# Patient Record
Sex: Female | Born: 1985 | Race: Black or African American | Hispanic: No | Smoking: Never smoker
Health system: Southern US, Community
[De-identification: ages and names within clinical notes are randomized; demographics above are authoritative.]

## PROBLEM LIST (undated history)

## (undated) HISTORY — PX: TONSILLECTOMY: SUR1361

## (undated) HISTORY — PX: TYMPANOSTOMY TUBE PLACEMENT: SHX32

---
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
## 2005-01-01 ENCOUNTER — Ambulatory Visit: Payer: Self-pay | Admitting: Pediatrics

## 2005-01-04 ENCOUNTER — Ambulatory Visit: Payer: Self-pay

## 2005-02-03 ENCOUNTER — Ambulatory Visit: Payer: Self-pay

## 2005-03-06 ENCOUNTER — Ambulatory Visit: Payer: Self-pay

## 2006-01-08 ENCOUNTER — Emergency Department: Payer: Self-pay | Admitting: Emergency Medicine

## 2006-03-20 ENCOUNTER — Emergency Department: Payer: Self-pay | Admitting: General Practice

## 2007-04-26 ENCOUNTER — Emergency Department: Payer: Self-pay | Admitting: Emergency Medicine

## 2010-02-25 ENCOUNTER — Emergency Department: Payer: Self-pay | Admitting: Emergency Medicine

## 2011-06-09 ENCOUNTER — Emergency Department: Payer: Self-pay | Admitting: Unknown Physician Specialty

## 2011-06-09 LAB — URINALYSIS, COMPLETE
Glucose,UR: NEGATIVE mg/dL (ref 0–75)
Nitrite: NEGATIVE
Protein: NEGATIVE
RBC,UR: 3 /HPF (ref 0–5)
Specific Gravity: 1.027 (ref 1.003–1.030)

## 2011-06-09 LAB — WET PREP, GENITAL

## 2012-01-02 ENCOUNTER — Emergency Department: Payer: Self-pay | Admitting: *Deleted

## 2012-01-02 LAB — URINALYSIS, COMPLETE
Bacteria: NONE SEEN
Glucose,UR: NEGATIVE mg/dL (ref 0–75)
Leukocyte Esterase: NEGATIVE
RBC,UR: 1 /HPF (ref 0–5)
Specific Gravity: 1.024 (ref 1.003–1.030)
Squamous Epithelial: <1
WBC UR: <1 /HPF (ref 0–5)

## 2012-01-02 LAB — CBC
HGB: 13 g/dL (ref 12.0–16.0)
MCH: 27.3 pg (ref 26.0–34.0)
MCHC: 33.4 g/dL (ref 32.0–36.0)
Platelet: 207 10*3/uL (ref 150–440)
RDW: 15.2 % — ABNORMAL HIGH (ref 11.5–14.5)
WBC: 7.5 10*3/uL (ref 3.6–11.0)

## 2012-01-02 LAB — HCG, QUANTITATIVE, PREGNANCY: Beta Hcg, Quant.: 4903 m[IU]/mL — ABNORMAL HIGH

## 2012-01-04 ENCOUNTER — Emergency Department: Payer: Self-pay | Admitting: Emergency Medicine

## 2012-01-04 LAB — CBC
HCT: 37.7 % (ref 35.0–47.0)
HGB: 12.3 g/dL (ref 12.0–16.0)
MCHC: 32.6 g/dL (ref 32.0–36.0)
MCV: 83 fL (ref 80–100)
RDW: 15.2 % — ABNORMAL HIGH (ref 11.5–14.5)

## 2012-04-21 ENCOUNTER — Emergency Department: Payer: Self-pay | Admitting: Emergency Medicine

## 2012-04-21 LAB — URINALYSIS, COMPLETE
Bacteria: NONE SEEN
Bilirubin,UR: NEGATIVE
Glucose,UR: NEGATIVE mg/dL (ref 0–75)
Nitrite: NEGATIVE
Ph: 5 (ref 4.5–8.0)
RBC,UR: <1 /HPF (ref 0–5)
Specific Gravity: 1.023 (ref 1.003–1.030)
WBC UR: 1 /HPF (ref 0–5)

## 2012-05-25 ENCOUNTER — Emergency Department: Payer: Self-pay | Admitting: Emergency Medicine

## 2012-05-25 LAB — URINALYSIS, COMPLETE
Bilirubin,UR: NEGATIVE
Glucose,UR: NEGATIVE mg/dL (ref 0–75)
Nitrite: POSITIVE
Ph: 6 (ref 4.5–8.0)
Protein: NEGATIVE
Squamous Epithelial: 4

## 2012-12-14 ENCOUNTER — Encounter: Payer: Self-pay | Admitting: Obstetrics and Gynecology

## 2012-12-21 ENCOUNTER — Encounter: Payer: Self-pay | Admitting: Maternal and Fetal Medicine

## 2012-12-28 ENCOUNTER — Encounter: Payer: Self-pay | Admitting: Obstetrics and Gynecology

## 2013-01-19 ENCOUNTER — Emergency Department: Payer: Self-pay | Admitting: Internal Medicine

## 2013-01-19 LAB — URINALYSIS, COMPLETE
Bilirubin,UR: NEGATIVE
Blood: NEGATIVE
Glucose,UR: NEGATIVE mg/dL (ref 0–75)
Ketone: NEGATIVE
Nitrite: NEGATIVE
Ph: 5 (ref 4.5–8.0)
Protein: NEGATIVE
Protein: NEGATIVE
RBC,UR: 3 /HPF (ref 0–5)
Specific Gravity: 1.035 (ref 1.003–1.030)
WBC UR: 4 /HPF (ref 0–5)
WBC UR: 1 /HPF (ref 0–5)

## 2013-01-19 LAB — WET PREP, GENITAL

## 2013-01-19 LAB — GC/CHLAMYDIA PROBE AMP

## 2013-06-27 ENCOUNTER — Inpatient Hospital Stay: Payer: Self-pay | Admitting: Obstetrics and Gynecology

## 2013-06-27 LAB — CBC WITH DIFFERENTIAL/PLATELET
Basophil #: 0.1 10*3/uL (ref 0.0–0.1)
Basophil %: 0.9 %
Eosinophil #: 0 10*3/uL (ref 0.0–0.7)
Eosinophil %: 0.4 %
HCT: 35 % (ref 35.0–47.0)
HGB: 11.6 g/dL — ABNORMAL LOW (ref 12.0–16.0)
LYMPHS ABS: 2 10*3/uL (ref 1.0–3.6)
Lymphocyte %: 27.2 %
MCH: 25.9 pg — ABNORMAL LOW (ref 26.0–34.0)
MCHC: 33.1 g/dL (ref 32.0–36.0)
MCV: 78 fL — ABNORMAL LOW (ref 80–100)
MONO ABS: 0.5 x10 3/mm (ref 0.2–0.9)
MONOS PCT: 6.1 %
NEUTROS ABS: 4.9 10*3/uL (ref 1.4–6.5)
Neutrophil %: 65.4 %
Platelet: 164 10*3/uL (ref 150–440)
RBC: 4.48 10*6/uL (ref 3.80–5.20)
RDW: 16.3 % — ABNORMAL HIGH (ref 11.5–14.5)
WBC: 7.5 10*3/uL (ref 3.6–11.0)

## 2013-06-27 LAB — CREATININE, SERUM: Creatinine: 0.68 mg/dL (ref 0.60–1.30)

## 2013-06-28 LAB — HEMATOCRIT: HCT: 31.2 % — ABNORMAL LOW (ref 35.0–47.0)

## 2014-01-25 ENCOUNTER — Emergency Department: Payer: Self-pay | Admitting: Emergency Medicine

## 2014-01-25 LAB — TROPONIN I: Troponin-I: <0.02 ng/mL

## 2014-01-25 LAB — CBC
HCT: 37.6 % (ref 35.0–47.0)
HGB: 11.9 g/dL — AB (ref 12.0–16.0)
MCH: 24.6 pg — ABNORMAL LOW (ref 26.0–34.0)
MCHC: 31.7 g/dL — ABNORMAL LOW (ref 32.0–36.0)
MCV: 77 fL — AB (ref 80–100)
PLATELETS: 215 10*3/uL (ref 150–440)
RBC: 4.85 10*6/uL (ref 3.80–5.20)
RDW: 17.7 % — ABNORMAL HIGH (ref 11.5–14.5)
WBC: 6.6 10*3/uL (ref 3.6–11.0)

## 2014-01-25 LAB — BASIC METABOLIC PANEL
ANION GAP: 7 (ref 7–16)
BUN: 12 mg/dL (ref 7–18)
CALCIUM: 8.5 mg/dL (ref 8.5–10.1)
CO2: 23 mmol/L (ref 21–32)
Chloride: 110 mmol/L — ABNORMAL HIGH (ref 98–107)
Creatinine: 0.96 mg/dL (ref 0.60–1.30)
EGFR (African American): >60
EGFR (Non-African Amer.): >60
Glucose: 68 mg/dL (ref 65–99)
Osmolality: 277 (ref 275–301)
Potassium: 3.6 mmol/L (ref 3.5–5.1)
Sodium: 140 mmol/L (ref 136–145)

## 2014-08-26 NOTE — Consult Note (Signed)
Referral Information:  Reason for Referral Morbid obesity, type II diabetes and suspected HTN in pregnancy   Referring Physician [facility]   Prenatal Hx [name] is a 29yo G2P0010 at 5812w3d by LMP (10/04/2012) who presents in consult.  She had an 8 week miscarriage at [facility] in [facility] (no D&C required).  Has a history of type II diabetes diagnosed at about age 89.  She was on Metformin from the time of diagnosis until she self-discontinued about 1 year ago.  HgbA1C this pregnancy was 6.  She checks her BS a couple of times a week.  She is also morbidly obese with a BMI of 64.  She states that her weight fluctuates and she feels she is a little less heavy than normal.  She does not exercise because she doesn't have anyone to do it with.  She denies a history of HTN but has had some mild range elevated BPs.   Past Obstetrical Hx 2013 8 week spontaneous abortion (no D&C).   Home Medications: Medication Instructions Status  Prenatal Multivitamins Prenatal Multivitamins oral tablet 1 tab(s) orally once a day Active   Allergies:   Antihistamine: Other  Vital Signs/Notes:  Nursing Vital Signs: **Vital Signs.:   11-Aug-14 10:15  Temperature Temperature (F) 98.2  Celsius 36.7  Temperature Source oral  Pulse Pulse 79  Respirations Respirations 20  Systolic BP Systolic BP 122  Diastolic BP (mmHg) Diastolic BP (mmHg) 75  Mean BP 90   Perinatal Consult:  LMP 04-Oct-2012   PGyn Hx Historoy of abnormal PAPs, s/p colposcopy; per patient, history of gonorrhea, chlamydia and syphylis   PMed Hx Rubella Nonimmune   Past Medical History cont'd Morbid obesity (BMI 64) Type II diabetes Hypertension   PSurg Hx Tonsils and adenoids removed (age 89); multiple sets of tubes in her ears; wisdom teeth   FHx Mother with HTN and MI; dad was killed on the job; maternal uncle with DM   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   Recommendations 1.  Morbid obesity.  Recommend baseline CMP, TSH and urine p/c ratio (not ordered today).  Add 1 mg folic acid to PNV and start baby ASA.  Referral to [facility].  Limit weight gain to <15 #.  Maternal EKG and echocardiogram (not ordered).  Encourage mild exercise (ie walking).  We discussed the risks associated with obesity in pregnancy including miscarriage, stillbirth, birth defects, difficulty in evaluating fetal anatomy, preeclampsia and need for cesarean delivery.  Recommend serial ultrasounds for growth (monthly after 28 weeks) and antenatal testing starting at 32-34 weeks.  Would recommend postpartum referral to weight loss management center and/or bariatric surgery.  Consider referral to [facility] or [facility] for obstetric bariatric services on L&D.  Will also need anesthesia consult in the third trimester. 2.  Type II diabetes - not currently being treated.  Was on Metformin until  about 1 year ago.  Most recent HgbA1C 6.0 in July.  Discussed recommendation for BS testing 4x daily with FBS goal <90 and 2hr PP meals <120.  Recommend eye exam (if not up to date in past year).  We briefly discussed treatment options including diet, glyburide and/or insulin.  Offered patient first trimester screening (scheduled in 2 weeks).  Recommend ultrasound to evaluate fetal anatomy at about 18 weeks and fetal echocardiogram at about 22 weeks. 3.  Hypertension - not previously  diagnosed.  Recommend (as above), baseline p/c ratio and CMP, maternal EKG and echocardiogram.  Currently stable. 4.  Rubella and varicella non-immune.  Immunize postpartum. 5.  Return for follow up consult and BS review in 1 week.  Will also refer to [facility] (no other orders placed today).   Plan:  Prenatal Diagnosis Options First trimester, Level II [place]   Ultrasound at what gestational ages Monthly > 28 weeks   Antepartum Testing Starting at 32 weeks    Total Time Spent with Patient 30 minutes   >50% of visit spent in couseling/coordination of care yes   Office Use Only [address]  Level 2 ( ) NEW office consult exp prob focused   Coding Description: MATERNAL CONDITIONS/HISTORY INDICATION(S).   Chronic HTN.   Diabetes - DM.   Obesity - BMI greater than equal to 30.  Electronic Signatures: Kirby Funk (MD)  (Signed 11-Aug-14 12:05)  Authored: Referral, Home Medications, Allergies, Vital Signs/Notes, Consult, Exam, Lab/Radiology Notes, Impression, Plan, Billing, Coding Description   Last Updated: 11-Aug-14 12:05 by Kirby Funk (MD)

## 2014-09-13 NOTE — H&P (Signed)
L&D Evaluation:  History Expanded:  HPI 29 yo G2P0010 AA F at 38 weeks s EGA w contractions and pain this am, [facility] due to Obesity, Type II DM diet controlled, and labile untreated HTN.  GBS + noted.  No other reported pbs this pregnancy.  Prenatal Care records reviewed.   Gravida 2   Term 0   PreTerm 0   Abortion 1   Living 0   Group B Strep Results Maternal (Result >5wks must be treated as unknown) positive   Presents with contractions   Patient's Medical History Diabetes  Hypertension  Obesity   Patient's Surgical History none   Medications Pre Natal Vitamins   Allergies Antihistamines   Social History none   Family History Non-Contributory   ROS:  ROS All systems were reviewed.  HEENT, CNS, GI, GU, Respiratory, CV, Renal and Musculoskeletal systems were found to be normal.   Exam:  Vital Signs stable   General no apparent distress   Mental Status clear   Chest clear   Heart normal sinus rhythm, no murmur/gallop/rubs   Abdomen gravid, tender with contractions   Estimated Fetal Weight Average for gestational age   Back no CVAT   Edema no edema   Pelvic no external lesions, 5/90/0   Mebranes Intact   FHT normal rate with no decels   Skin dry   Lymph no lymphadenopathy   Impression:  Impression active labor   Plan:  Plan EFM/NST, monitor contractions and for cervical change, monitor BP, antibiotics for GBBS prophylaxis   Comments BS 92. AROM clear. IUPC, FSE placed. Stadol for pain. Risks of obesity and delivery discussed.  Risks of anethesia and Cesarean Section discussed.   Electronic Signatures: Letitia LibraHarris, Rayquon Uselman Paul (MD)  (Signed [phone] [id number]-Feb-15 13:08)  Authored: L&D Evaluation   Last Updated: 22-Feb-15 13:08 by Letitia LibraHarris, Rashod Gougeon Paul (MD)

## 2014-11-04 IMAGING — US US OB NUCHAL TRANSLUCENCY 1ST GEST - [facility] NRPT
1 series · 14 of 28 positions shown · non-contrast
Comparison: none

[Series 1: us ob nuchal translucency 1st gest - [facility] nrpt · 0.23mm/px · 14 of 58 slices shown]
[im 3/58]
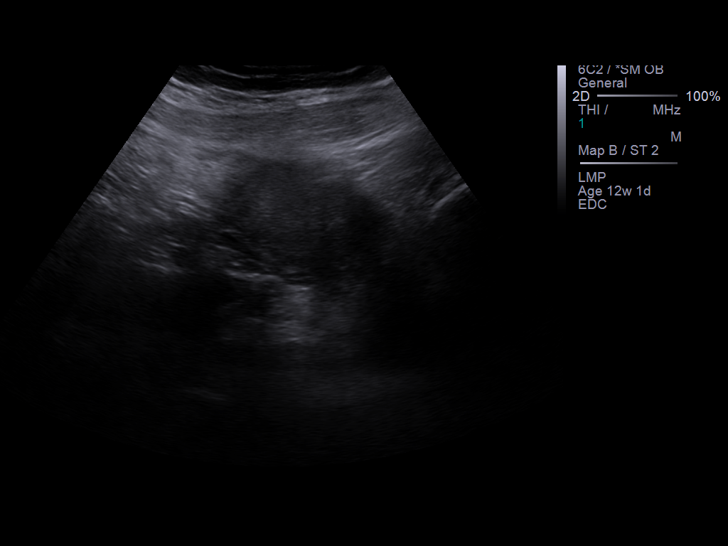
[im 7/58]
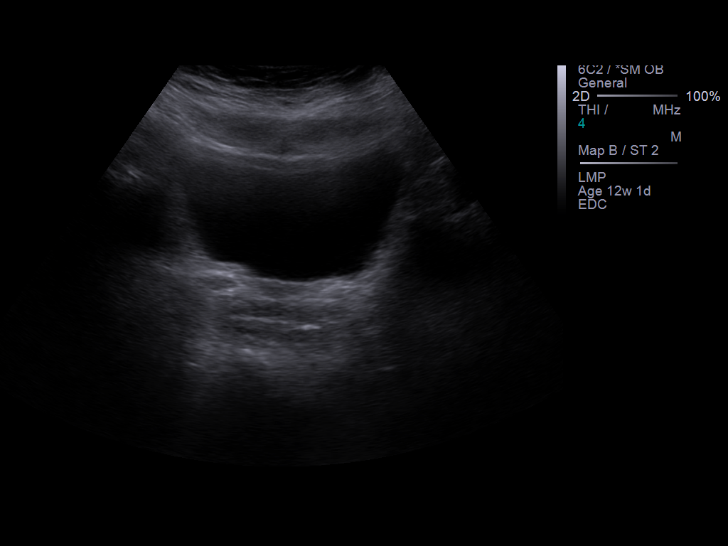
[im 11/58]
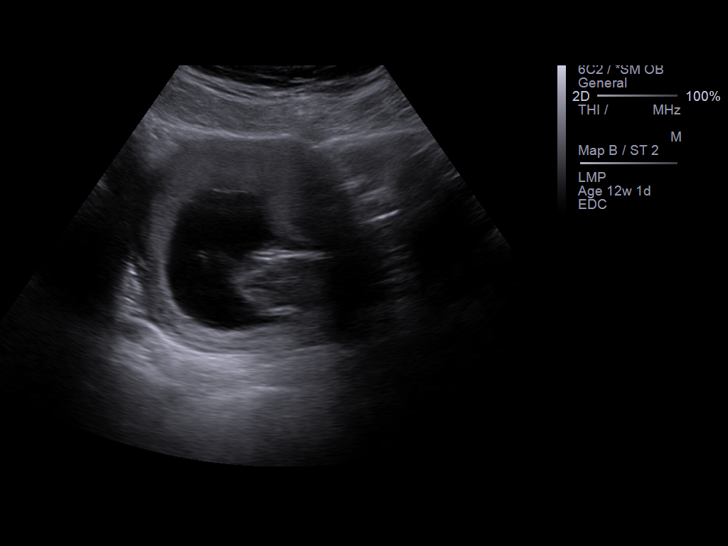
[im 15/58]
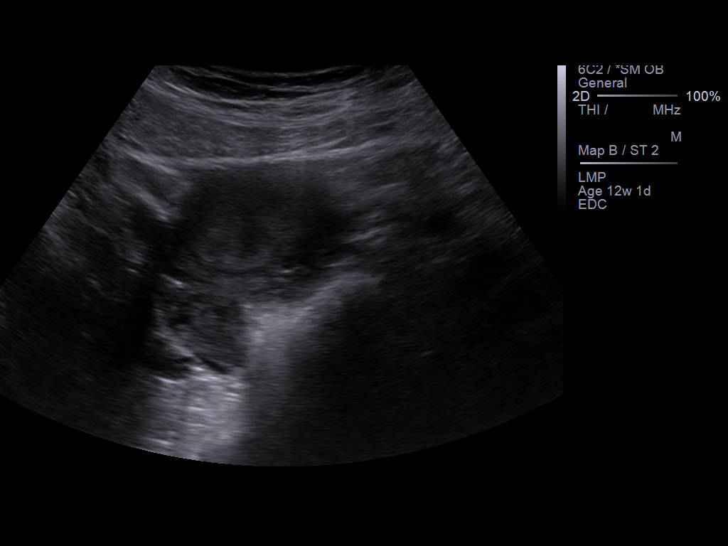
[im 20/58]
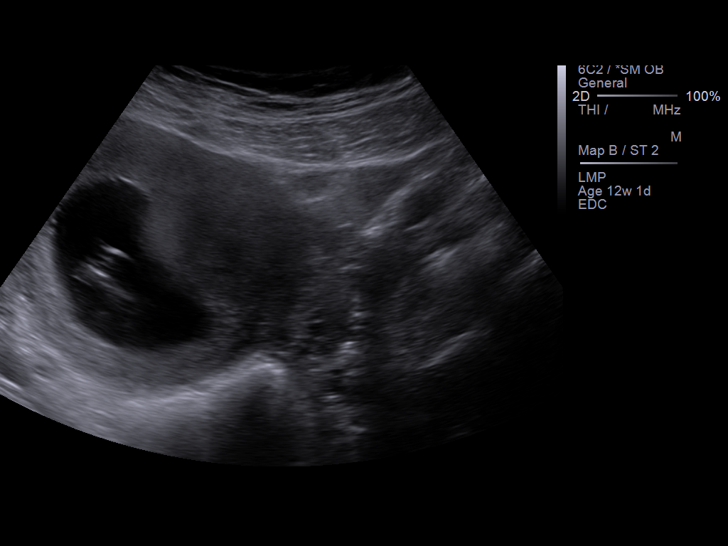
[im 24/58]
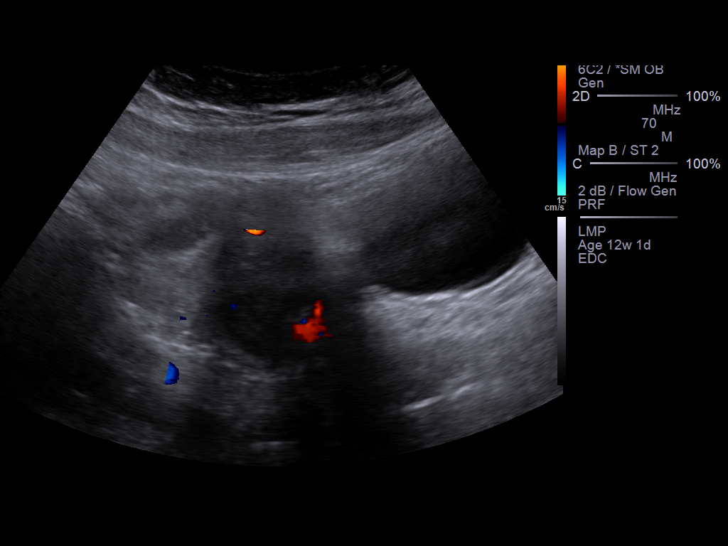
[im 28/58]
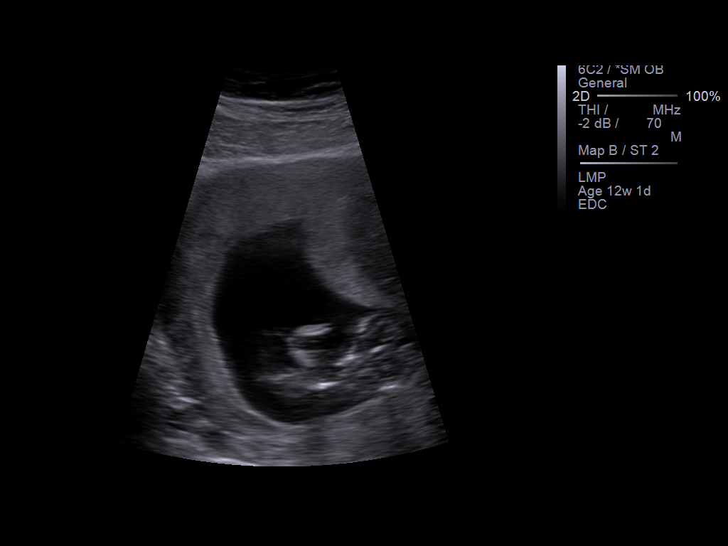
[im 32/58]
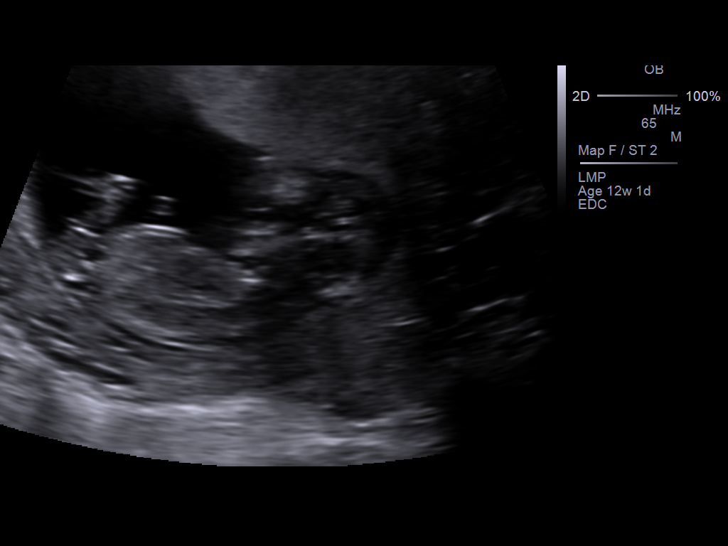
[im 36/58]
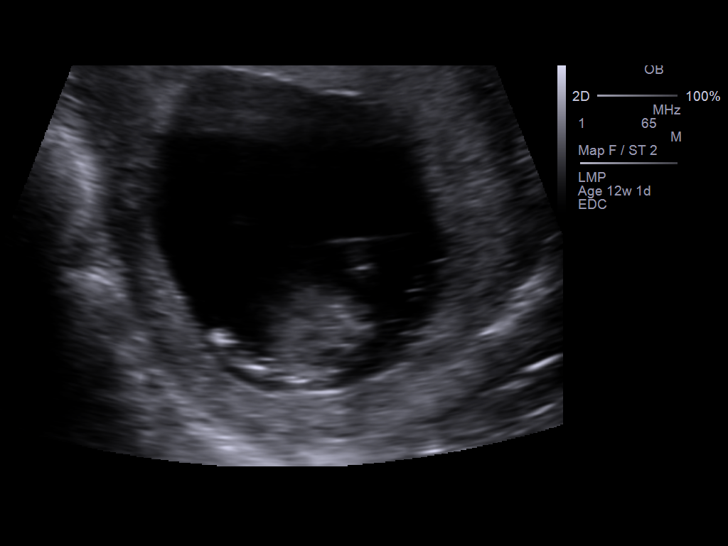
[im 41/58]
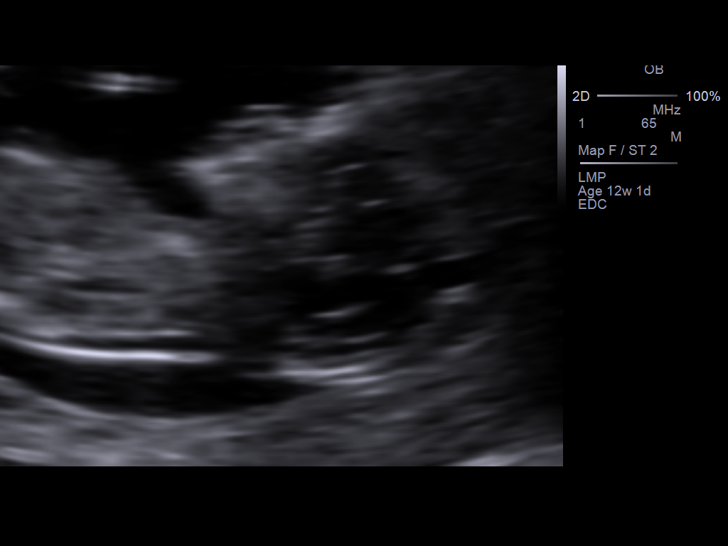
[im 45/58]
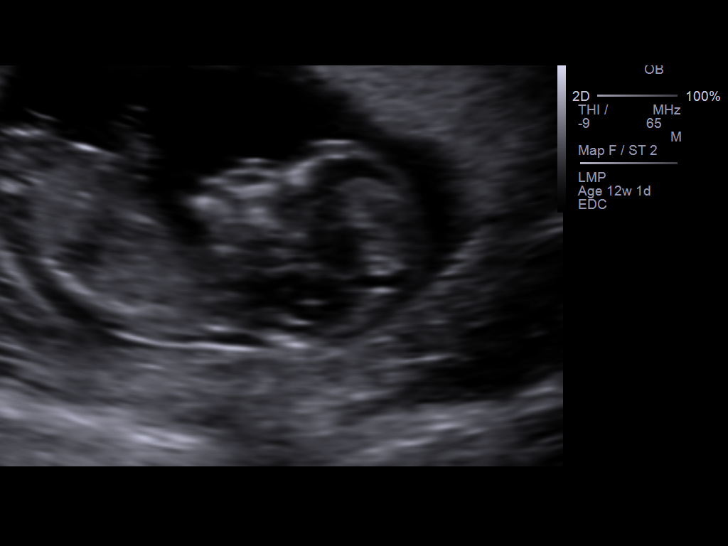
[im 49/58]
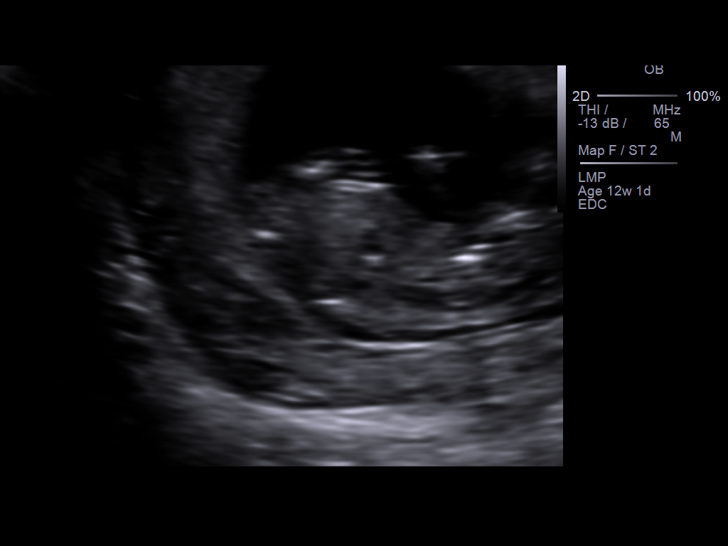
[im 53/58]
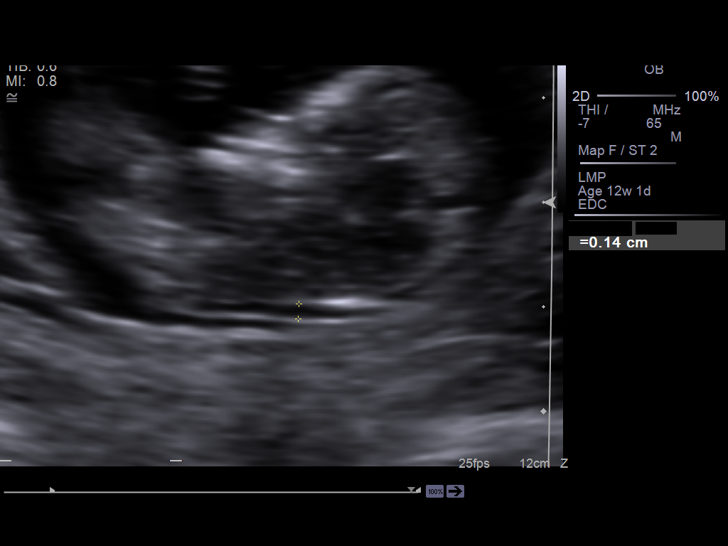
[im 58/58]
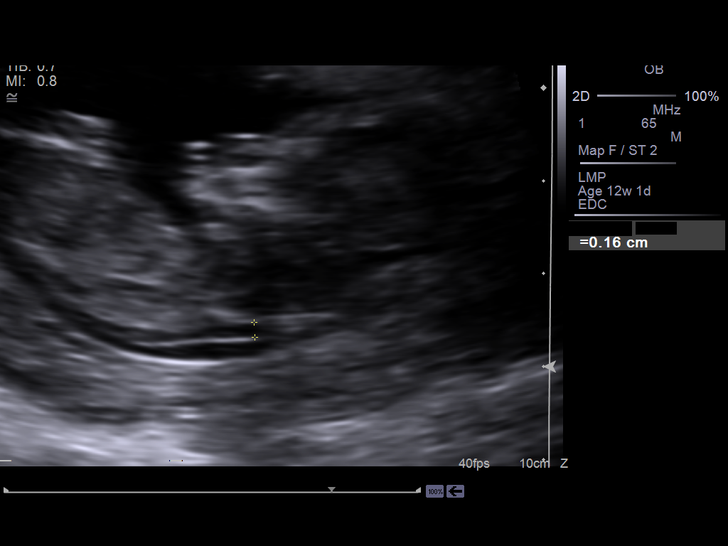

[14 of 28 positions shown; findings below may reference images not displayed]

IMAGES IMPORTED FROM THE SYNGO WORKFLOW SYSTEM
NO DICTATION FOR STUDY

## 2015-12-02 IMAGING — CR DG CHEST 2V
1 series · 2 of 2 positions shown · non-contrast
Comparison: None.

CLINICAL DATA: Chest pain

EXAM:
CHEST  2 VIEW

[Series 1: dxr chest pa (or ap) and lateral · 0.14mm/px · 2 of 2 slices shown]
[im 1/2]
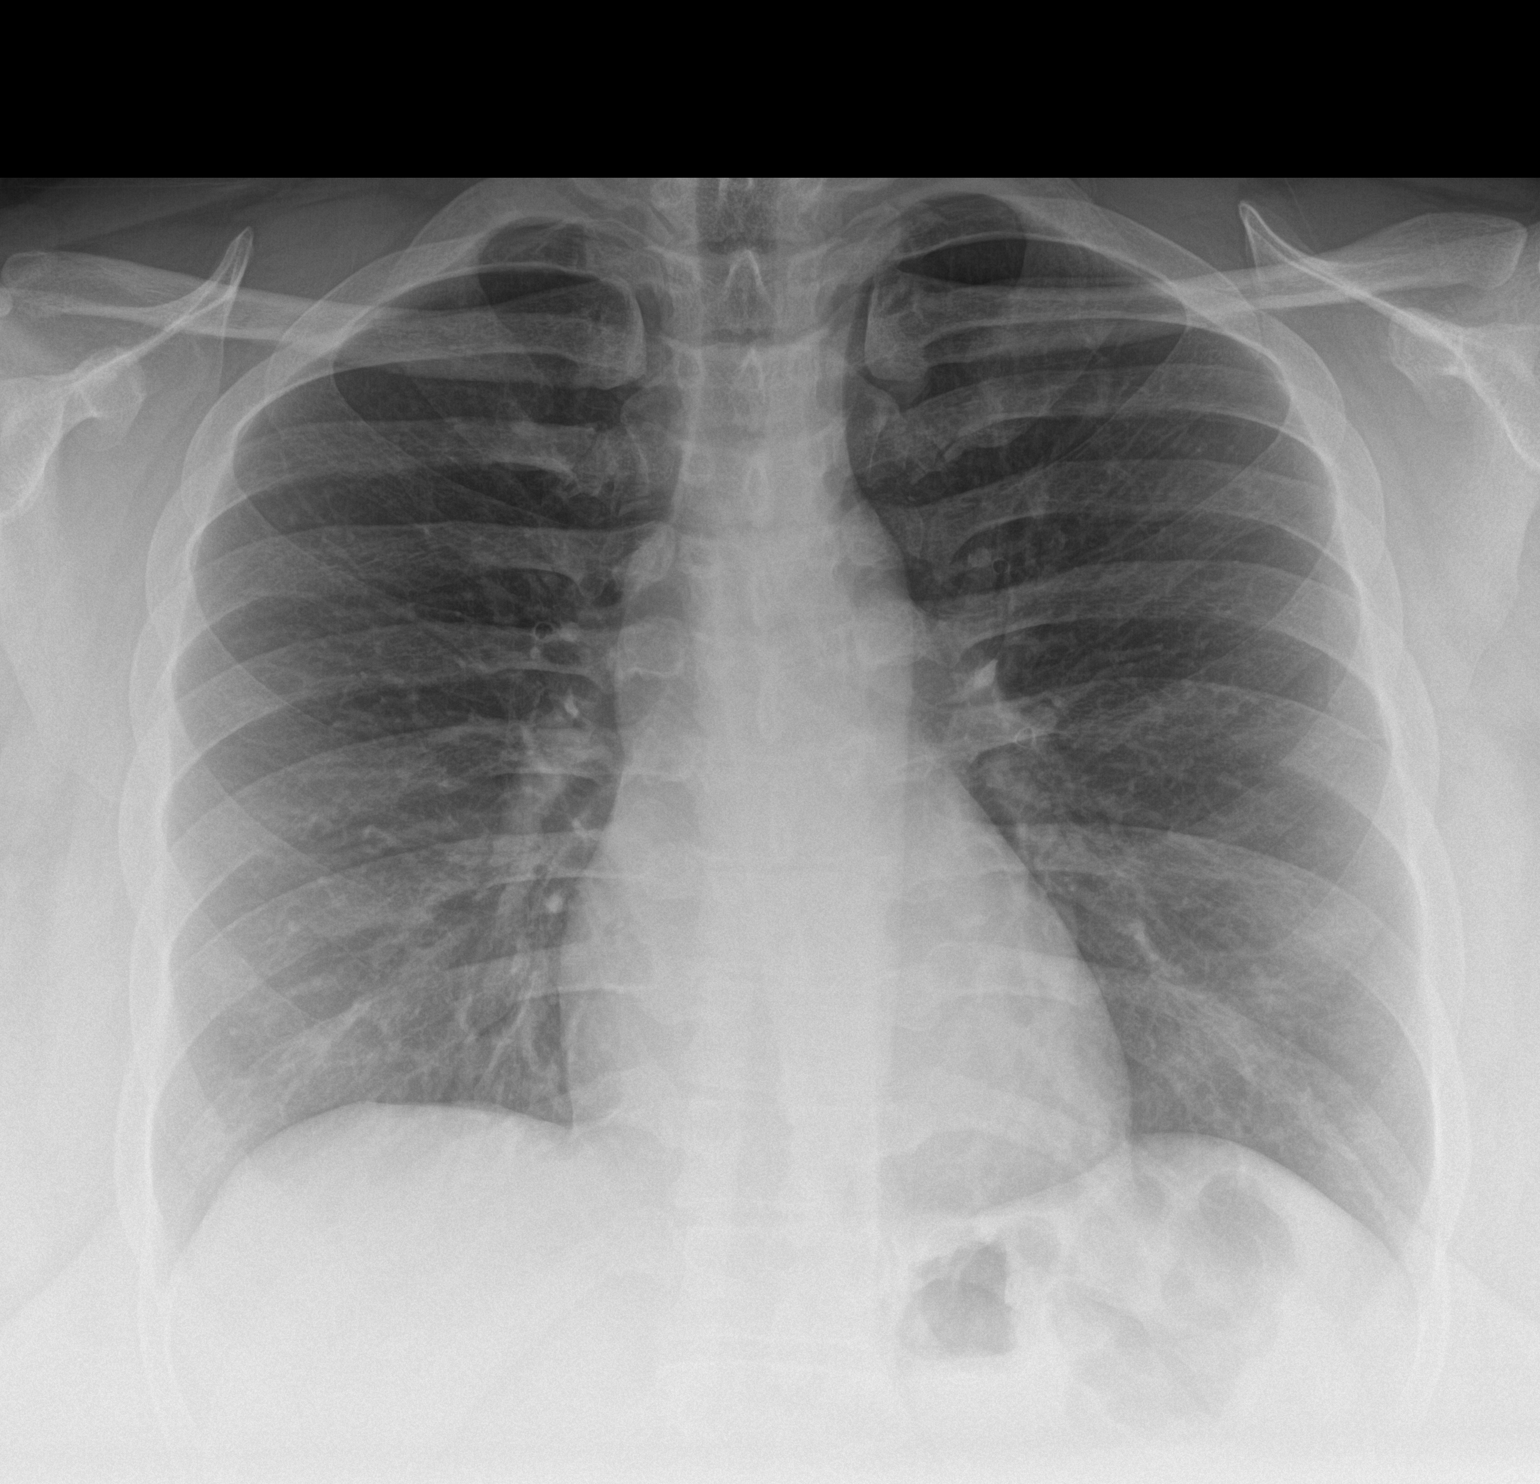
[im 2/2]
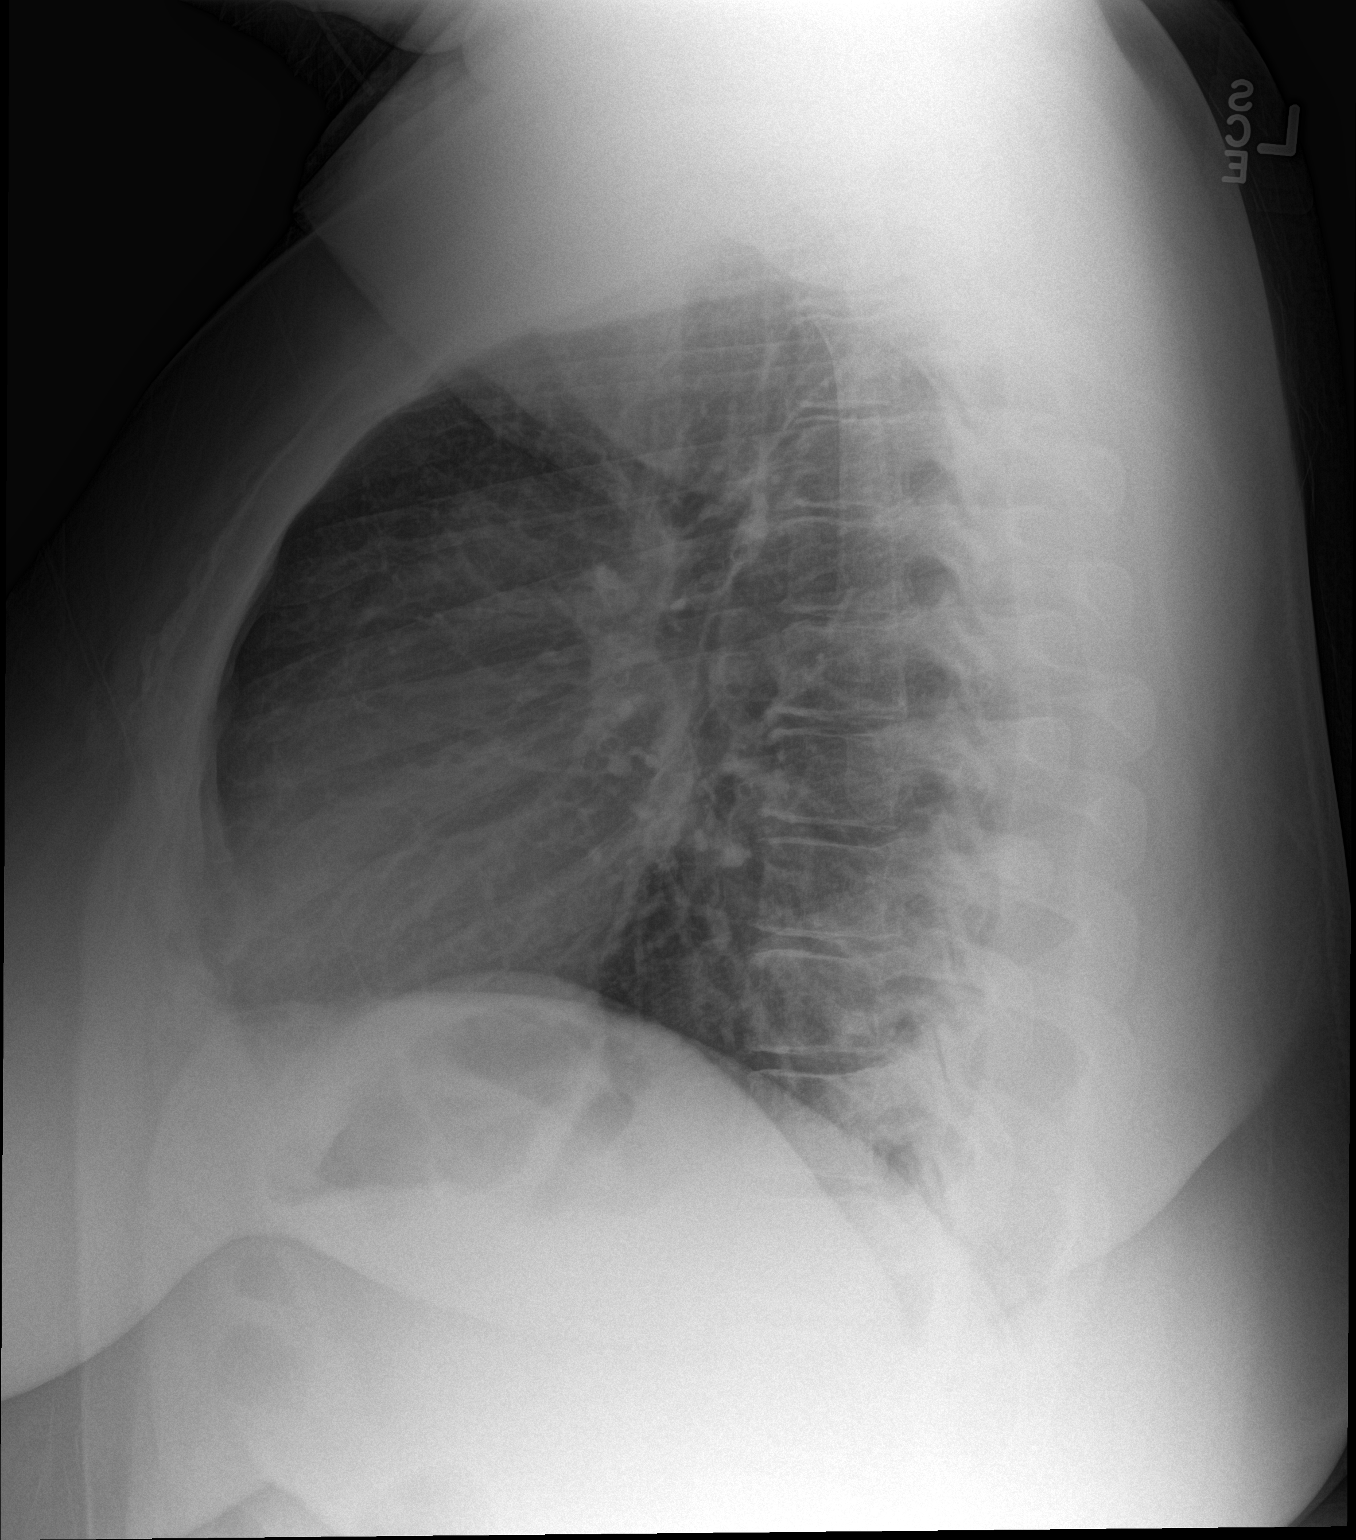

[2 of 2 positions shown; findings below may reference images not displayed]

FINDINGS: Lungs are clear. Heart size and pulmonary vascularity are normal. No
pneumothorax. No adenopathy. No bone lesions.
IMPRESSION: No edema or consolidation.

## 2016-11-13 LAB — HM HIV SCREENING LAB: HM HIV Screening: NEGATIVE

## 2016-11-13 LAB — HM PAP SMEAR: HM Pap smear: NEGATIVE

## 2018-01-25 ENCOUNTER — Other Ambulatory Visit: Payer: Self-pay

## 2018-01-25 ENCOUNTER — Emergency Department
Admission: EM | Admit: 2018-01-25 | Discharge: 2018-01-25 | Disposition: A | Discharge status: Home / Self Care | Payer: Self-pay | Attending: Emergency Medicine | Admitting: Emergency Medicine

## 2018-01-25 ENCOUNTER — Encounter: Payer: Self-pay | Admitting: Emergency Medicine

## 2018-01-25 DIAGNOSIS — H1031 Unspecified acute conjunctivitis, right eye: Secondary | ICD-10-CM

## 2018-01-25 MED ORDER — TOBRAMYCIN 0.3 % OP SOLN: 2.0000 [drp] | OPHTHALMIC | 0 refills | Status: DC

## 2018-01-25 NOTE — ED Triage Notes (Signed)
Right eye redness, soreness.  Noticed redness today.

## 2018-01-25 NOTE — ED Provider Notes (Signed)
[facility] Emergency Department Provider Note  ____________________________________________   First MD Initiated Contact with Patient 01/25/18 1603     (approximate)  I have reviewed the triage vital signs and the nursing notes.   HISTORY  Chief Complaint Eye Problem    HPI Monica Medina is a 32 y.o. female presents emergency department complaint of right eye redness and soreness.  She denies any drainage or matting at this time.  Symptoms started this morning when she awoke.  She denies any fever or chills.  No change in her vision.    History reviewed. No pertinent past medical history.  There are no active problems to display for this patient.   Past Surgical History:  Procedure Laterality Date  . TONSILLECTOMY    . TYMPANOSTOMY TUBE PLACEMENT      Prior to Admission medications   Medication Sig Start Date End Date Taking? Authorizing Provider  tobramycin (TOBREX) 0.3 % ophthalmic solution Place 2 drops into both eyes every 4 (four) hours. 01/25/18   Faythe GheeFisher, Susan W, PA-C    Allergies Patient has no known allergies.  No family history on file.  Social History Social History   Tobacco Use  . Smoking status: Never Smoker  . Smokeless tobacco: Never Used  Substance Use Topics  . Alcohol use: Not on file  . Drug use: Not on file    Review of Systems  Constitutional: No fever/chills Eyes: No visual changes.  Positive for right eye redness ENT: No sore throat. Respiratory: Denies cough Genitourinary: Negative for dysuria. Musculoskeletal: Negative for back pain. Skin: Negative for rash.    ____________________________________________   PHYSICAL EXAM:  VITAL SIGNS: ED Triage Vitals  Enc Vitals Group     BP 01/25/18 1538 127/64     Pulse Rate 01/25/18 1538 78     Resp 01/25/18 1538 16     Temp 01/25/18 1538 98.7 F (37.1 C)     Temp Source 01/25/18 1538 Oral     SpO2 01/25/18 1538 97 %     Weight 01/25/18 1535  (!) 370 lb (167.8 kg)     Height 01/25/18 1535 5\' 4"  (1.626 m)     Head Circumference --      Peak Flow --      Pain Score 01/25/18 1535 4     Pain Loc --      Pain Edu? --      Excl. in GC? --     Constitutional: Alert and oriented. Well appearing and in no acute distress. Eyes: Conjunctivae are injected in the right eye.  No pus or matting is noted.  Left eye is within normal limits Head: Atraumatic. Nose: No congestion/rhinnorhea. Mouth/Throat: Mucous membranes are moist.   Neck:  supple no lymphadenopathy noted Cardiovascular: Normal rate, regular rhythm. Heart sounds are normal Respiratory: Normal respiratory effort.  No retractions, lungs c t a  GU: deferred Musculoskeletal: FROM all extremities, warm and well perfused Neurologic:  Normal speech and language.  Skin:  Skin is warm, dry and intact. No rash noted. Psychiatric: Mood and affect are normal. Speech and behavior are normal.  ____________________________________________   LABS (all labs ordered are listed, but only abnormal results are displayed)  Labs Reviewed - No data to display ____________________________________________   ____________________________________________  RADIOLOGY    ____________________________________________   PROCEDURES  Procedure(s) performed: No  Procedures    ____________________________________________   INITIAL IMPRESSION / ASSESSMENT AND PLAN / ED COURSE  Pertinent labs & imaging  results that were available during my care of the patient were reviewed by me and considered in my medical decision making (see chart for details).    Patient is 32 year old female presents emergency department complaining of right eye redness.  No visual changes or injury.  Physical exam the right eye has injected conjunctiva in the right side.  No matting or drainage noted.  Remainder the exam is unremarkable  Discussed findings with patient.  Due to concerns of pinkeye with her  employer she was given prescription of tobramycin ophthalmic drops.  She is able to return if she has had 24 hours of eyedrops.  She was given a work note stating this.  She was discharged in stable condition with instructions to follow-up with eye doctor if worsening her regular doctor if not better in 3 to 5 days.    As part of my medical decision making, I reviewed the following data within the electronic MEDICAL RECORD NUMBER Nursing notes reviewed and incorporated, Notes from prior ED visits and [place] Controlled Substance Database  ____________________________________________   FINAL CLINICAL IMPRESSION(S) / ED DIAGNOSES  Final diagnoses:  Acute conjunctivitis of right eye, unspecified acute conjunctivitis type      NEW MEDICATIONS STARTED DURING THIS VISIT:  New Prescriptions   TOBRAMYCIN (TOBREX) 0.3 % OPHTHALMIC SOLUTION    Place 2 drops into both eyes every 4 (four) hours.     Note:  This document was prepared using Dragon voice recognition software and may include unintentional dictation errors.    Faythe Ghee, PA-C 01/25/18 1616    Merrily Brittle, MD 01/26/18 [phone]

## 2018-01-25 NOTE — Discharge Instructions (Addendum)
Follow-up with your regular doctor or the acute care if not better in 3 days.  If you are worsening please follow-up with [facility].  Take the medication as prescribed.  Once you have had drops for the next 24 hours she may return to work.

## 2018-01-31 ENCOUNTER — Encounter: Payer: Self-pay | Admitting: Emergency Medicine

## 2018-01-31 ENCOUNTER — Emergency Department
Admission: EM | Admit: 2018-01-31 | Discharge: 2018-01-31 | Disposition: A | Discharge status: Home / Self Care | Payer: Self-pay | Attending: Emergency Medicine | Admitting: Emergency Medicine

## 2018-01-31 ENCOUNTER — Other Ambulatory Visit: Payer: Self-pay

## 2018-01-31 DIAGNOSIS — H1011 Acute atopic conjunctivitis, right eye: Secondary | ICD-10-CM | POA: Insufficient documentation

## 2018-01-31 MED ORDER — TETRACAINE HCL 0.5 % OP SOLN
1.0000 [drp] | Freq: Once | OPHTHALMIC | Status: AC
  Administered 2018-01-31: 1 [drp] via OPHTHALMIC
  Filled 2018-01-31: qty 4

## 2018-01-31 MED ORDER — FLUORESCEIN SODIUM 1 MG OP STRP
1.0000 | ORAL_STRIP | Freq: Once | OPHTHALMIC | Status: AC
  Administered 2018-01-31: 1 via OPHTHALMIC
  Filled 2018-01-31: qty 1

## 2018-01-31 MED ORDER — KETOROLAC TROMETHAMINE 0.5 % OP SOLN: 1.0000 [drp] | Freq: Four times a day (QID) | OPHTHALMIC | 0 refills | Status: DC

## 2018-01-31 NOTE — Discharge Instructions (Addendum)
Call [facility] for an appointment.  Begin using Acular ophthalmic solution.  1 drop to the right eye 4 times a day.

## 2018-01-31 NOTE — ED Notes (Signed)
See triage note  States she was seen on Sunday for right eye irritation   states the redness went away but returned this am    Min drainage noted this am

## 2018-01-31 NOTE — ED Provider Notes (Signed)
[facility] Emergency Department Provider Note   ____________________________________________   First MD Initiated Contact with Patient 01/31/18 1340     (approximate)  I have reviewed the triage vital signs and the nursing notes.   HISTORY  Chief Complaint Eye Problem   HPI Monica Medina is a 32 y.o. female presents to the emergency department with complaint of right eye irritation and redness that began this morning.  Patient states that she was seen 1 week ago for the same and was given some eyedrops.  She states that the redness that she experienced at that time completely resolved.  She states there has been some clear drainage from the area but nothing that was purulent.  She denies any matting of her eyelashes or foreign body sensation.  She denies any changes in her vision.  Patient does endorse some itching of her right eye and occasional sneezing.  She states she is unable to take antihistamines due to an adverse reaction.  History reviewed. No pertinent past medical history.  There are no active problems to display for this patient.   Past Surgical History:  Procedure Laterality Date  . TONSILLECTOMY    . TYMPANOSTOMY TUBE PLACEMENT      Prior to Admission medications   Medication Sig Start Date End Date Taking? Authorizing Provider  ketorolac (ACULAR) 0.5 % ophthalmic solution Place 1 drop into the right eye 4 (four) times daily. 01/31/18   Tommi Rumps, PA-C    Allergies Patient has no known allergies.  No family history on file.  Social History Social History   Tobacco Use  . Smoking status: Never Smoker  . Smokeless tobacco: Never Used  Substance Use Topics  . Alcohol use: Not on file  . Drug use: Not on file    Review of Systems Constitutional: No fever/chills Eyes: No visual changes.  Positive right eye redness. Cardiovascular: Denies chest pain. Respiratory: Denies shortness of breath. Musculoskeletal:  Negative for muscle aches. Skin: Negative for rash. Neurological: Negative for headaches, focal weakness or numbness. ___________________________________________   PHYSICAL EXAM:  VITAL SIGNS: ED Triage Vitals [01/31/18 1316]  Enc Vitals Group     BP (!) 142/72     Pulse Rate 62     Resp 18     Temp 98 F (36.7 C)     Temp Source Oral     SpO2 97 %     Weight (!) 370 lb (167.8 kg)     Height 5\' 4"  (1.626 m)     Head Circumference      Peak Flow      Pain Score 0     Pain Loc      Pain Edu?      Excl. in GC?    Constitutional: Alert and oriented. Well appearing and in no acute distress. Eyes: Conjunctivae injected on the right.  Left eye conjunctive  is normal.  PERRL. EOMI. tetracaine was placed in the eye.  Lid was inverted no foreign body was noted.  Examination does not show any abnormality.  Fluorescein dye was placed in the right eye.  No corneal abrasion was noted.  No drainage is noted.  No crusting or involvement of the lashes is seen. Head: Atraumatic. Nose: No congestion/rhinnorhea. Neck: No stridor.   Cardiovascular: Normal rate, regular rhythm. Grossly normal heart sounds.  Good peripheral circulation. Respiratory: Normal respiratory effort.  No retractions. Lungs CTAB. Musculoskeletal: Moves upper and lower extremities with any difficulty and normal gait  was noted. Neurologic:  Normal speech and language. No gross focal neurologic deficits are appreciated. Skin:  Skin is warm, dry and intact. No rash noted. Psychiatric: Mood and affect are normal. Speech and behavior are normal.  ____________________________________________   LABS (all labs ordered are listed, but only abnormal results are displayed)  Labs Reviewed - No data to display  PROCEDURES  Procedure(s) performed: None  Procedures  Critical Care performed: No  ____________________________________________   INITIAL IMPRESSION / ASSESSMENT AND PLAN / ED COURSE  As part of my medical  decision making, I reviewed the following data within the electronic MEDICAL RECORD NUMBER Notes from prior ED visits and [place] Controlled Substance Database  Patient presents to the ED with complaint of right eye irritation along with some itching and sneezing.  Patient was seen 1 week ago at which time she states that the antibiotic eyedrops cleared her eye.  She denies any recent injury or visual changes.  Visual acuity was reassuring.  Patient was placed on Acular to begin using 1 drop 4 times a day to the right eye.  Patient relates that she cannot take antihistamines due to nosebleeds.  She states that her doctor has tried her on several things for allergies without any medication not causing nosebleeds.  She is to follow-up with her PCP and also [facility] if any continued problems.  ____________________________________________   FINAL CLINICAL IMPRESSION(S) / ED DIAGNOSES  Final diagnoses:  Allergic conjunctivitis of right eye     ED Discharge Orders         Ordered    ketorolac (ACULAR) 0.5 % ophthalmic solution  4 times daily     01/31/18 1424           Note:  This document was prepared using Dragon voice recognition software and may include unintentional dictation errors.    Tommi Rumps, PA-C 01/31/18 1709    Nita Sickle, MD 02/02/18 [phone]

## 2018-01-31 NOTE — ED Triage Notes (Signed)
R eye irritation and redness this am. States had same one week ago and got some eye drops and it cleared up.

## 2019-01-15 ENCOUNTER — Other Ambulatory Visit: Payer: Self-pay

## 2019-01-15 DIAGNOSIS — Z20822 Contact with and (suspected) exposure to covid-19: Secondary | ICD-10-CM

## 2019-01-17 LAB — NOVEL CORONAVIRUS, NAA: SARS-CoV-2, NAA: NOT DETECTED

## 2019-05-16 ENCOUNTER — Other Ambulatory Visit: Payer: Self-pay

## 2019-05-16 ENCOUNTER — Emergency Department
Admission: EM | Admit: 2019-05-16 | Discharge: 2019-05-16 | Disposition: A | Discharge status: Home / Self Care | Payer: Self-pay | Attending: Emergency Medicine | Admitting: Emergency Medicine

## 2019-05-16 ENCOUNTER — Encounter: Payer: Self-pay | Admitting: Emergency Medicine

## 2019-05-16 DIAGNOSIS — Y939 Activity, unspecified: Secondary | ICD-10-CM | POA: Insufficient documentation

## 2019-05-16 DIAGNOSIS — Y999 Unspecified external cause status: Secondary | ICD-10-CM | POA: Insufficient documentation

## 2019-05-16 DIAGNOSIS — W57XXXA Bitten or stung by nonvenomous insect and other nonvenomous arthropods, initial encounter: Secondary | ICD-10-CM | POA: Insufficient documentation

## 2019-05-16 DIAGNOSIS — E669 Obesity, unspecified: Secondary | ICD-10-CM | POA: Insufficient documentation

## 2019-05-16 DIAGNOSIS — Y929 Unspecified place or not applicable: Secondary | ICD-10-CM | POA: Insufficient documentation

## 2019-05-16 DIAGNOSIS — S80862A Insect bite (nonvenomous), left lower leg, initial encounter: Secondary | ICD-10-CM | POA: Insufficient documentation

## 2019-05-16 NOTE — ED Notes (Signed)
See triage note  Presents with possible insect bite  States noticed area about 2 weeks ago  No redness or drainage note  Afebrile on arrival

## 2019-05-16 NOTE — Discharge Instructions (Addendum)
You were seen today for an insect bite of your left leg.  This insect bite is not infected and is likely turning into a dermatofibroma.  You can apply hydrocortisone cream mixed with Neosporin twice daily for the next 5 days to help with itching.  Please avoid scratching the area.  Dermatofibromas are benign lesions that typically do not require any intervention.  Follow-up if area becomes red, swollen or you notice drainage from the area.

## 2019-05-16 NOTE — ED Provider Notes (Signed)
[facility] Emergency Department Provider Note ____________________________________________  Time seen: 1335  I have reviewed the triage vital signs and the nursing notes.  HISTORY  Chief Complaint  Insect Bite   HPI Monica Medina is a 34 y.o. female presents to the clinic today with complaint of an insect bite to her left posterior calf.  She reports this occurred 2 to 3 weeks ago.  She reports it was initially a little red and drained pus.  It seemed to be improving until 1 week ago.  She reports it is now raised, scaly with darkened skin.  She denies fever, chills or body aches.  She has tried Neosporin OTC with minimal relief.  History reviewed. No pertinent past medical history.  There are no problems to display for this patient.   Past Surgical History:  Procedure Laterality Date  . TONSILLECTOMY    . TYMPANOSTOMY TUBE PLACEMENT      Prior to Admission medications   Not on File    Allergies Other  No family history on file.  Social History Social History   Tobacco Use  . Smoking status: Never Smoker  . Smokeless tobacco: Never Used  Substance Use Topics  . Alcohol use: Not Currently  . Drug use: Not Currently    Review of Systems  Constitutional: Negative for fever, chills. ECardiovascular: Negative for chest pain or chest tightness. Respiratory: Negative for cough or shortness of breath. Skin: Positive for insect bite. Neurological: Negative for headaches, focal weakness, tingling or numbness. ____________________________________________  PHYSICAL EXAM:  VITAL SIGNS: ED Triage Vitals  Enc Vitals Group     BP 05/16/19 1259 131/66     Pulse Rate 05/16/19 1259 71     Resp 05/16/19 1259 16     Temp 05/16/19 1259 98.3 F (36.8 C)     Temp Source 05/16/19 1259 Oral     SpO2 05/16/19 1259 100 %     Weight 05/16/19 1257 (!) 360 lb (163.3 kg)     Height 05/16/19 1257 5\' 4"  (1.626 m)     Head Circumference --      Peak  Flow --      Pain Score 05/16/19 1257 0     Pain Loc --      Pain Edu? --      Excl. in GC? --     Constitutional: Alert and oriented.  Obese, in no distress. Cardiovascular: Normal rate, regular rhythm.  Respiratory: Normal respiratory effort. No wheezes/rales/rhonchi. Neurologic:  Normal gait without ataxia. Normal speech and language.  Skin: 0.5 cm hyperpigmented, scaly raised lesion of left lateral posterior calf.  No redness, swelling or drainage noted from the area. ____________________________________________  INITIAL IMPRESSION / ASSESSMENT AND PLAN / ED COURSE  Insect Bite of Left Calf:  Not infected Likely will turn into a dermatofibroma Avoid picking the area Apply Neosporin mixed with Hydrocortisone BID x 5 days Discussed this is a benign lesion and would not need further intervention likely  ____________________________________________  FINAL CLINICAL IMPRESSION(S) / ED DIAGNOSES  Final diagnoses:  Insect bite of left lower leg, initial encounter   07/14/19, NP    Nicki Reaper, NP 05/16/19 1347    07/14/19, MD 05/16/19 1519

## 2019-05-16 NOTE — ED Triage Notes (Signed)
Pt to ED via POV c/o insect bite on the back of her leg. Pt states that it has been there for about 2 weeks. Pt states that she thinks that it is infected now. Pt denies drainage from the area. No redness noted. Pt is in NAD.

## 2019-09-30 ENCOUNTER — Other Ambulatory Visit: Payer: Self-pay

## 2019-09-30 ENCOUNTER — Ambulatory Visit (LOCAL_COMMUNITY_HEALTH_CENTER): Payer: Self-pay

## 2019-09-30 VITALS — BP 123/79 | Wt >= 6400 oz

## 2019-09-30 DIAGNOSIS — Z3202 Encounter for pregnancy test, result negative: Secondary | ICD-10-CM

## 2019-09-30 LAB — PREGNANCY, URINE: Preg Test, Ur: NEGATIVE

## 2019-10-01 NOTE — Progress Notes (Signed)
Patient reports had period this month and last month but has been having some stomach pains off/on.  States "just haven't felt right". Patient has no PCP at this time; PCP list given. Patient currently has Nexplanon.  Richmond Campbell, RN

## 2020-05-11 ENCOUNTER — Emergency Department
Admission: EM | Admit: 2020-05-11 | Discharge: 2020-05-11 | Disposition: A | Discharge status: Home / Self Care | Payer: HRSA Program | Attending: Family Medicine | Admitting: Family Medicine

## 2020-05-11 ENCOUNTER — Other Ambulatory Visit: Payer: Self-pay

## 2020-05-11 DIAGNOSIS — U071 COVID-19: Secondary | ICD-10-CM | POA: Diagnosis not present

## 2020-05-11 DIAGNOSIS — R059 Cough, unspecified: Secondary | ICD-10-CM | POA: Diagnosis present

## 2020-05-11 DIAGNOSIS — E119 Type 2 diabetes mellitus without complications: Secondary | ICD-10-CM | POA: Diagnosis not present

## 2020-05-11 DIAGNOSIS — B349 Viral infection, unspecified: Secondary | ICD-10-CM

## 2020-05-11 LAB — SARS CORONAVIRUS 2 (TAT 6-24 HRS): SARS Coronavirus 2: POSITIVE — AB

## 2020-05-11 MED ORDER — GUAIFENESIN-CODEINE 100-10 MG/5ML PO SYRP: 5.0000 mL | ORAL_SOLUTION | Freq: Three times a day (TID) | ORAL | 0 refills | Status: DC | PRN

## 2020-05-11 MED ORDER — NAPROXEN 500 MG PO TABS: 500.0000 mg | ORAL_TABLET | Freq: Two times a day (BID) | ORAL | 0 refills | Status: DC

## 2020-05-11 NOTE — ED Notes (Signed)
Pt unable to sign for d/c no access to signature pad. Pt verbalizes d/c instructions with no further questions at this time.

## 2020-05-11 NOTE — ED Triage Notes (Signed)
Pt to ED POV for chief complaint of cough, body aches, diarrhea x1 that started over the weekend.  Ambulatory to triage. RR even and unlabored. NAD noted. Alert and oriented  Denies pain

## 2020-05-11 NOTE — ED Provider Notes (Signed)
[facility] Emergency Department Provider Note  ____________________________________________  Time seen: Approximately 11:31 AM  I have reviewed the triage vital signs and the nursing notes.   HISTORY  Chief Complaint COVID sx   HPI Monica Medina is a 35 y.o. female who presents to the ER for COVID symptoms that started 5 days ago. No known exposure. No fever. She does have a nonproductive cough and low grade fever. No relief with OTC medications.   History reviewed. No pertinent past medical history.  Patient Active Problem List   Diagnosis Date Noted  . DM type 2 (diabetes mellitus, type 2) (HCC) 01/07/2013  . Morbid obesity with BMI of 60.0-69.9, adult (HCC) 01/07/2013    Past Surgical History:  Procedure Laterality Date  . TONSILLECTOMY    . TYMPANOSTOMY TUBE PLACEMENT      Prior to Admission medications   Medication Sig Start Date End Date Taking? Authorizing Provider  guaiFENesin-codeine (ROBITUSSIN AC) 100-10 MG/5ML syrup Take 5 mLs by mouth 3 (three) times daily as needed for cough. 05/11/20  Yes Mizraim Harmening B, FNP  naproxen (NAPROSYN) 500 MG tablet Take 1 tablet (500 mg total) by mouth 2 (two) times daily with a meal. 05/11/20  Yes Nevena Rozenberg B, FNP  etonogestrel (NEXPLANON) 68 MG IMPL implant 1 each by Subdermal route once. 10/12/18   Matt Holmes, PA    Allergies Antihistamines, diphenhydramine-type  Family History  Problem Relation Age of Onset  . Hypertension Mother   . Hypertension Sister   . Prostate cancer Paternal Grandfather     Social History Social History   Tobacco Use  . Smoking status: Never Smoker  . Smokeless tobacco: Never Used  Substance Use Topics  . Alcohol use: Not Currently  . Drug use: Not Currently    Review of Systems Constitutional: Positive for fever/chills. Normal appetite. ENT: No sore throat. Cardiovascular: Denies chest pain. Respiratory: Negative for shortness of breath.  Positive for cough. Negative for wheezing.  Gastrointestinal: Negative for nausea,  no vomiting.  Negative diarrhea.  Musculoskeletal: Positive for body aches Skin: Negative for rash. Neurological: Positive for headaches ____________________________________________   PHYSICAL EXAM:  VITAL SIGNS: ED Triage Vitals  Enc Vitals Group     BP 05/11/20 1115 128/68     Pulse Rate 05/11/20 1115 91     Resp 05/11/20 1115 20     Temp 05/11/20 1115 99.5 F (37.5 C)     Temp Source 05/11/20 1115 Oral     SpO2 05/11/20 1115 95 %     Weight 05/11/20 1117 (!) 370 lb (167.8 kg)     Height 05/11/20 1117 5\' 1"  (1.549 m)     Head Circumference --      Peak Flow --      Pain Score 05/11/20 1117 0     Pain Loc --      Pain Edu? --      Excl. in GC? --     Constitutional: Alert and oriented. Overall well appearing and in no acute distress. Eyes: Conjunctivae are normal. Ears: Exam deferred. Nose: No sinus congestion noted; no rhinnorhea. Mouth/Throat: Mucous membranes are moist.  Oropharynx normal. Tonsils without exudate. Uvula midline. Neck: No stridor.  Lymphatic: No cervical lymphadenopathy. Cardiovascular: Normal rate, regular rhythm. Good peripheral circulation. Respiratory: Respirations are even and unlabored.  No retractions. Breath sounds clear. Gastrointestinal: Soft and nontender.  Musculoskeletal: FROM x 4 extremities.  Neurologic:  Normal speech and language. Skin:  Skin is warm, dry  and intact. No rash noted. Psychiatric: Mood and affect are normal. Speech and behavior are normal.  ____________________________________________   LABS (all labs ordered are listed, but only abnormal results are displayed)  Labs Reviewed  SARS CORONAVIRUS 2 (TAT 6-24 HRS) - Abnormal; Notable for the following components:      Result Value   SARS Coronavirus 2 POSITIVE (*)    All other components within normal limits   ____________________________________________  EKG  Not  indicated. ____________________________________________  RADIOLOGY  Not indicated. ____________________________________________   PROCEDURES  Procedure(s) performed: None  Critical Care performed: No ____________________________________________   INITIAL IMPRESSION / ASSESSMENT AND PLAN / ED COURSE  35 y.o. female presents to the ER for COVID testing and treatment. See HPI. Plan will be to test and treat with Robitussin and naprosyn. She is to see PCP or return to the ER for symptoms of concern.    Medications - No data to display  ED Discharge Orders         Ordered    guaiFENesin-codeine (ROBITUSSIN AC) 100-10 MG/5ML syrup  3 times daily PRN        05/11/20 1137    naproxen (NAPROSYN) 500 MG tablet  2 times daily with meals        05/11/20 1137           Pertinent labs & imaging results that were available during my care of the patient were reviewed by me and considered in my medical decision making (see chart for details).    If controlled substance prescribed during this visit, 12 month history viewed on the [facility] prior to issuing an initial prescription for Schedule II or III opiod. ____________________________________________   FINAL CLINICAL IMPRESSION(S) / ED DIAGNOSES  Final diagnoses:  Acute viral syndrome    Note:  This document was prepared using Dragon voice recognition software and may include unintentional dictation errors.    Chinita Pester, FNP 05/12/20 0818    Concha Se, MD 05/12/20 1246

## 2021-12-13 ENCOUNTER — Ambulatory Visit: Payer: Self-pay

## 2022-10-03 ENCOUNTER — Encounter: Payer: Self-pay | Admitting: Family Medicine

## 2022-10-03 ENCOUNTER — Ambulatory Visit (LOCAL_COMMUNITY_HEALTH_CENTER): Payer: Self-pay | Admitting: Family Medicine

## 2022-10-03 VITALS — BP 117/69 | Wt 390.5 lb

## 2022-10-03 DIAGNOSIS — Z01419 Encounter for gynecological examination (general) (routine) without abnormal findings: Secondary | ICD-10-CM

## 2022-10-03 DIAGNOSIS — Z3046 Encounter for surveillance of implantable subdermal contraceptive: Secondary | ICD-10-CM

## 2022-10-03 DIAGNOSIS — Z3009 Encounter for other general counseling and advice on contraception: Secondary | ICD-10-CM

## 2022-10-03 DIAGNOSIS — A599 Trichomoniasis, unspecified: Secondary | ICD-10-CM

## 2022-10-03 DIAGNOSIS — Z113 Encounter for screening for infections with a predominantly sexual mode of transmission: Secondary | ICD-10-CM

## 2022-10-03 LAB — WET PREP FOR TRICH, YEAST, CLUE
Trichomonas Exam: POSITIVE — AB
Yeast Exam: NEGATIVE

## 2022-10-03 LAB — HM HIV SCREENING LAB: HM HIV Screening: NEGATIVE

## 2022-10-03 MED ORDER — METRONIDAZOLE 500 MG PO TABS
500.0000 mg | ORAL_TABLET | Freq: Two times a day (BID) | ORAL | 0 refills | Status: AC
Start: 2022-10-03 — End: 2022-10-10

## 2022-10-03 MED ORDER — ETONOGESTREL 68 MG ~~LOC~~ IMPL
68.0000 mg | DRUG_IMPLANT | Freq: Once | SUBCUTANEOUS | Status: AC
Start: 2022-10-03 — End: 2022-10-03
  Administered 2022-10-03: 68 mg via SUBCUTANEOUS

## 2022-10-03 NOTE — Progress Notes (Signed)
Pt here for annual physical examination, pap smear, and Nexplanon removal, reinsertion.  Wet mount results reviewed with patient.  Results positive for [facility].  Metronidazole 500mg  #14 1 po BID x 7 days dispensed to patient.  Counseled re medication, side effects, plan of care and when to contact clinic for questions or concerns.  Nexplanon removed and reinserted without complications by Aliene Altes, FNP.  Family planning packet given.  Condoms declined.-Collins Scotland, RN

## 2022-10-03 NOTE — Progress Notes (Signed)
[facility] DEPARTMENT [facility] [address] Main Number: [phone]  Family Planning Visit- Repeat Yearly Visit  Subjective:  Monica Medina is a 37 y.o. G2P0011  being seen today for an annual wellness visit and to discuss contraception options.   The patient is currently using Hormonal Implant for pregnancy prevention. Patient does not want a pregnancy in the next year.    report they are looking for a method that provides High efficacy at preventing pregnancy   Patient has the following medical problems: has DM type 2 (diabetes mellitus, type 2) (HCC) and Morbid obesity with BMI of 60.0-69.9, adult (HCC) on their problem list.  Chief Complaint  Patient presents with   Annual Exam    PE/Nex removal/insert    Patient reports to clinic for PE and removal and reinsertion of nexplanon.   Patient denies concerns about self.    See flowsheet for other program required questions.   Body mass index is 73.78 kg/m. - Patient is eligible for diabetes screening based on BMI> 25 and age >35?  yes HA1C ordered? yes  Patient reports 1 of partners in last year. Desires STI screening?  Yes   Has patient been screened once for HCV in the past?  No  No results found for: "HCVAB"  Does the patient have current of drug use, have a partner with drug use, and/or has been incarcerated since last result? No  If yes-- Screen for HCV through [facility]   Does the patient meet criteria for HBV testing? No  Criteria:  -Household, sexual or needle sharing contact with HBV -History of drug use -HIV positive -Those with known Hep C   Health Maintenance Due  Topic Date Due   COVID-19 Vaccine (1) Never done   FOOT EXAM  Never done   OPHTHALMOLOGY EXAM  Never done   Diabetic kidney evaluation - Urine ACR  Never done   Hepatitis C Screening  Never done   HEMOGLOBIN A1C  10/20/2012   Diabetic kidney evaluation - eGFR measurement  01/26/2015    PAP SMEAR-Modifier  11/13/2021    Review of Systems  Constitutional:  Negative for weight loss.  Eyes:  Negative for blurred vision.  Respiratory:  Negative for cough and shortness of breath.   Cardiovascular:  Negative for claudication.  Gastrointestinal:  Negative for nausea.  Genitourinary:  Negative for dysuria and frequency.  Skin:  Negative for rash.  Neurological:  Negative for headaches.  Endo/Heme/Allergies:  Does not bruise/bleed easily.    The following portions of the patient's history were reviewed and updated as appropriate: allergies, current medications, past family history, past medical history, past social history, past surgical history and problem list. Problem list updated.  Objective:   Vitals:   10/03/22 0919  BP: 117/69  Weight: (!) 390 lb 8 oz (177.1 kg)    Physical Exam Vitals and nursing note reviewed.  Constitutional:      Appearance: She is obese.  HENT:     Head: Normocephalic and atraumatic.     Mouth/Throat:     Mouth: Mucous membranes are moist.     Pharynx: Oropharynx is clear. No oropharyngeal exudate or posterior oropharyngeal erythema.  Pulmonary:     Effort: Pulmonary effort is normal.  Abdominal:     Palpations: Abdomen is soft. There is no mass.     Tenderness: There is no abdominal tenderness. There is no rebound.  Genitourinary:    General: Normal vulva.  Exam position: Lithotomy position.     Pubic Area: No rash or pubic lice.      Labia:        Right: No rash or lesion.        Left: No rash or lesion.      Vagina: Vaginal discharge present. No erythema, bleeding or lesions.     Cervix: No cervical motion tenderness, discharge, friability, lesion or erythema.     Uterus: Normal.      Adnexa: Right adnexa normal and left adnexa normal.     Rectum: Normal.     Comments: pH = 4  Very difficult to visualize cervix due to anatomy  Mild amt of white discharge Lymphadenopathy:     Head:     Right side of head: No  preauricular or posterior auricular adenopathy.     Left side of head: No preauricular or posterior auricular adenopathy.     Cervical: No cervical adenopathy.     Upper Body:     Right upper body: No supraclavicular, axillary or epitrochlear adenopathy.     Left upper body: No supraclavicular, axillary or epitrochlear adenopathy.     Lower Body: No right inguinal adenopathy. No left inguinal adenopathy.  Skin:    General: Skin is warm and dry.     Findings: No rash.  Neurological:     Mental Status: She is alert and oriented to person, place, and time.     Assessment and Plan:  Monica Medina is a 36 y.o. female G2P0011 presenting to the [facility] Department for an yearly wellness and contraception visit  1. Well woman exam with routine gynecological exam -due for a pap today -CBE declined  - IGP, Aptima HPV  2. Morbid obesity with BMI of 70 and over, adult (HCC) -per chart review- hx of DM2- however pt denies this -ordered A1c today  - Hgb A1c w/o eAG  3. Family planning Contraception counseling: Reviewed options based on patient desire and reproductive life plan. Patient is interested in Hormonal Implant. This was provided to the patient today.   Risks, benefits, and typical effectiveness rates were reviewed.  Questions were answered.  Written information was also given to the patient to review.    The patient will follow up in  1 years for surveillance.  The patient was told to call with any further questions, or with any concerns about this method of contraception.  Emphasized use of condoms 100% of the time for STI prevention.  Educated on ECP and assessed need for ECP. Not indicated- Nexplanon in place  4. Screening for venereal disease  - HIV [facility] - Syphilis Serology, [facility] - Chlamydia/Gonorrhea  Lab - WET PREP FOR TRICH, YEAST, CLUE  5. Encounter for removal and reinsertion of Nexplanon Nexplanon Removal and Insertion   Patient identified, informed consent performed, consent signed.   Patient does understand that irregular bleeding is a very common side effect of this medication. She was advised to have backup contraception for one week after replacement of the implant. Patient deemed to meet WHO criteria for being reasonably certain she is not pregnant.  Appropriate time out taken. Nexplanon site identified. Area prepped in usual sterile fashon. 3 ml of 1% lidocaine with epinephrine was used to anesthetize the area at the distal end of the implant. A small stab incision was made right beside the implant on the distal portion. The Nexplanon rod was grasped using hemostats and removed without difficulty.  There was minimal blood loss. There were no complications.   Confirmed correct location of insertion site. The insertion site was identified 8-10 cm (3-4 inches) from the medial epicondyle of the humerus and 3-5 cm (1.25-2 inches) posterior to (below) the sulcus (groove) between the biceps and triceps muscles of the patient's left arm. New Nexplanon removed from packaging, Device confirmed in needle, then inserted full length of needle and withdrawn per handbook instructions. Nexplanon was able to palpated in the patient's left arm; patient palpated the insert herself.  There was minimal blood loss. Patient insertion site covered with guaze and a pressure bandage to reduce any bruising. The patient tolerated the procedure well and was given post procedure instructions.     Nexplanon:   Counseled patient to take OTC analgesic starting as soon as lidocaine starts to wear off and take regularly for at least 48 hr to decrease discomfort.  Specifically to take with food or milk to decrease stomach upset and for IB 600 mg (3 tablets) every 6 hrs; IB 800 mg (4 tablets) every 8 hrs; or Aleve 2 tablets every 12 hrs.   - etonogestrel (NEXPLANON) implant 68 mg  No follow-ups on file.  No future appointments.  Lenice Llamas,  [place]

## 2022-10-04 ENCOUNTER — Telehealth: Payer: Self-pay

## 2022-10-04 LAB — HGB A1C W/O EAG: Hgb A1c MFr Bld: 6.2 % — ABNORMAL HIGH (ref 4.8–5.6)

## 2022-10-04 NOTE — Telephone Encounter (Signed)
Pt notified of A1C results.  She stated that "I already seen them on My Chart"

## 2022-10-04 NOTE — Telephone Encounter (Signed)
-----   Message from [place], [place] sent at 10/04/2022  9:39 AM EDT ----- Prediabetes. Please contact patient and encourage seeing a PCP.

## 2022-10-08 ENCOUNTER — Other Ambulatory Visit: Payer: Self-pay

## 2022-10-09 ENCOUNTER — Telehealth: Payer: Self-pay

## 2022-10-09 NOTE — Telephone Encounter (Signed)
Pt notified of positive Chlamydia results.  Treatment appointment made for 6/6 @ 9:00 am

## 2022-10-10 ENCOUNTER — Ambulatory Visit: Payer: Self-pay

## 2022-10-10 DIAGNOSIS — A749 Chlamydial infection, unspecified: Secondary | ICD-10-CM

## 2022-10-10 LAB — IGP, APTIMA HPV
HPV Aptima: NEGATIVE
PAP Smear Comment: 0

## 2022-10-10 MED ORDER — DOXYCYCLINE HYCLATE 100 MG PO TABS
100.0000 mg | ORAL_TABLET | Freq: Two times a day (BID) | ORAL | 0 refills | Status: AC
Start: 2022-10-10 — End: 2022-10-17

## 2022-10-10 NOTE — Progress Notes (Signed)
Pt is here for treatment of Chlamydia.   Last sex approximately 1 year ago. LMP 09/01/2022. Nexplanon inserted 10/03/3022. The patient was dispensed Doxycycline 100 mg #14 today. I provided counseling today regarding the medication. We discussed the medication, the side effects and when to call clinic. Patient given the opportunity to ask questions. Questions answered.  Pt refused condoms, Doxycyline information sheet and Chlamydia pamphlet.   Pt has already informed previous partner about positive Trich and Chlamydia results.

## 2024-03-24 ENCOUNTER — Emergency Department
Admission: EM | Admit: 2024-03-24 | Discharge: 2024-03-24 | Disposition: A | Discharge status: Home / Self Care | Payer: Self-pay | Attending: Emergency Medicine | Admitting: Emergency Medicine

## 2024-03-24 ENCOUNTER — Other Ambulatory Visit: Payer: Self-pay

## 2024-03-24 DIAGNOSIS — E119 Type 2 diabetes mellitus without complications: Secondary | ICD-10-CM | POA: Insufficient documentation

## 2024-03-24 DIAGNOSIS — H1031 Unspecified acute conjunctivitis, right eye: Secondary | ICD-10-CM | POA: Insufficient documentation

## 2024-03-24 MED ORDER — KETOROLAC TROMETHAMINE 0.5 % OP SOLN: 1.0000 [drp] | Freq: Four times a day (QID) | OPHTHALMIC | 0 refills | Status: AC

## 2024-03-24 MED ORDER — CIPROFLOXACIN HCL 0.3 % OP SOLN: 1.0000 [drp] | OPHTHALMIC | 0 refills | Status: AC

## 2024-03-24 NOTE — ED Provider Notes (Signed)
   [facility] Provider Note    Event Date/Time   First MD Initiated Contact with Patient 03/24/24 0802     (approximate)   History   Eye Drainage   HPI  CHARISA TWITTY is a 38 y.o. female with a history of type 2 diabetes and as listed in EMR presents to the emergency department for treatment and evaluation of irritation and drainage to the right eye.  Symptoms were present upon awakening this morning.  No known contact with conjunctivitis but she works at [employer] there is potential exposure.  No visual changes.  She does not wear contact lenses.  No fever.     Physical Exam    Vitals:   03/24/24 0758  BP: (!) 144/97  Pulse: 72  Resp: 20  Temp: 98.1 F (36.7 C)  SpO2: 100%    General: Awake, no distress.  CV:  Good peripheral perfusion.  Resp:  Normal effort.  Abd:  No distention.  Other:  Right eye mildly erythematous to limbus.  Clear drainage present   ED Results / Procedures / Treatments   Labs (all labs ordered are listed, but only abnormal results are displayed)  Labs Reviewed - No data to display   EKG  Not indicated   RADIOLOGY  Image and radiology report reviewed and interpreted by me. Radiology report consistent with the same.  Not indicated  PROCEDURES:  Critical Care performed: No  Procedures   MEDICATIONS ORDERED IN ED:  Medications - No data to display   IMPRESSION / MDM / ASSESSMENT AND PLAN / ED COURSE   I have reviewed the triage note and vital signs. Vital signs stable   Differential diagnosis includes, but is not limited to, viral conjunctivitis, bacterial conjunctivitis, corneal abrasion, foreign body  Patient's presentation is most consistent with acute illness / injury with system symptoms.  38 year old female presenting to the emergency department for treatment and evaluation of right eye redness and irritation that was present upon awakening this morning.  On exam, eye is  red and appears irritated with watery drainage.  Symptoms most likely viral conjunctivitis.  Because she is also type [facility] plan will be to treat her with ciprofloxacin  drops.  Acular  prescribed for irritation sensation.  She was encouraged to use the drops as prescribed.  Work note provided for today.  Outpatient follow-up and ER return precautions discussed.      FINAL CLINICAL IMPRESSION(S) / ED DIAGNOSES   Final diagnoses:  Acute conjunctivitis of right eye, unspecified acute conjunctivitis type     Rx / DC Orders   ED Discharge Orders          Ordered    ketorolac  (ACULAR ) 0.5 % ophthalmic solution  4 times daily        03/24/24 0827    ciprofloxacin  (CILOXAN ) 0.3 % ophthalmic solution  Every 4 hours while awake        03/24/24 0827             Note:  This document was prepared using Dragon voice recognition software and may include unintentional dictation errors.   Herlinda Kirk NOVAK, FNP 03/24/24 9082    Dicky Anes, MD 03/24/24 1626

## 2024-03-24 NOTE — ED Triage Notes (Signed)
 Patient states drainage and swelling to right eye.

## 2024-03-24 NOTE — Discharge Instructions (Signed)
Please follow up with primary care if not improving over the next few days.  Return to the ER for symptoms that change or worsen if unable to schedule an appointment.
